# Patient Record
Sex: Male | Born: 1985
Health system: Southern US, Community
[De-identification: ages and names within clinical notes are randomized; demographics above are authoritative.]

## PROBLEM LIST (undated history)

## (undated) DIAGNOSIS — F909 Attention-deficit hyperactivity disorder, unspecified type: Secondary | ICD-10-CM

---
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
## 2007-07-07 ENCOUNTER — Encounter: Admission: RE | Admit: 2007-07-07 | Discharge: 2007-07-07 | Payer: Self-pay | Admitting: Gastroenterology

## 2008-05-04 IMAGING — RF DG ESOPHAGUS
11 of 13 series · 20 of 24 positions shown · non-contrast
Comparison: none

CLINICAL DATA: Epigastric burning and pain.  Vomiting. 
 BARIUM ESOPHAGRAM:

[Series 1: run · 2 of 8 slices shown (1 of 11)]
[im 1/8]
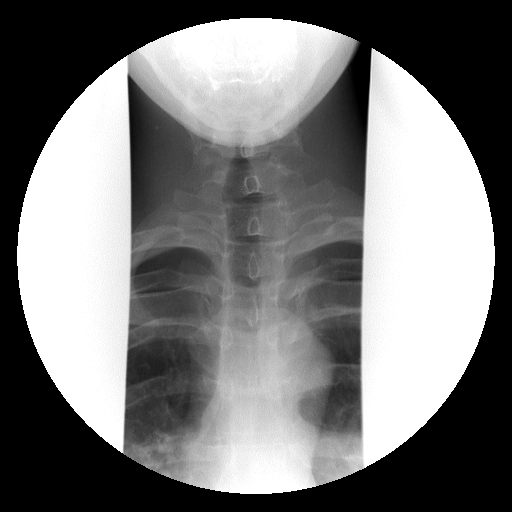
[im 4/8]
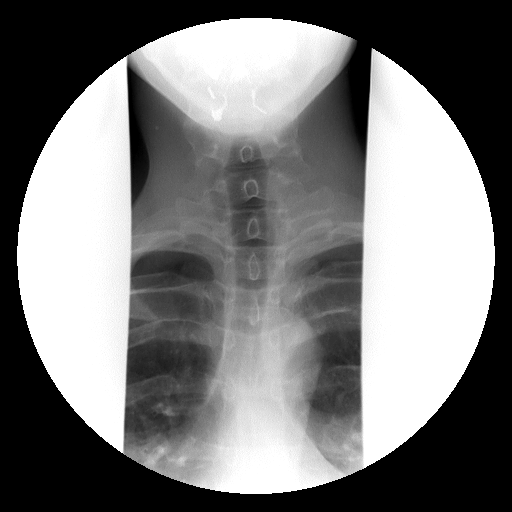

[Series 2: run · 2 of 5 slices shown (2 of 11)]
[im 1/5]
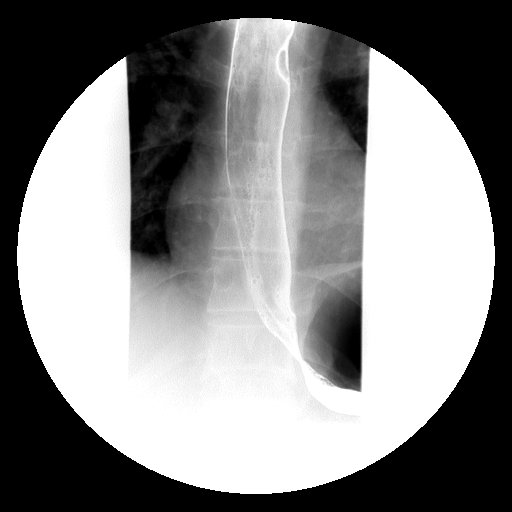
[im 5/5]
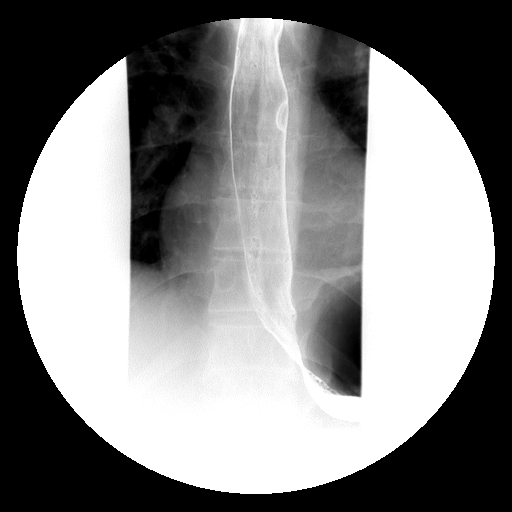

[Series 3: run · 1 of 1 slices shown (3 of 11)]
[im 1/1]
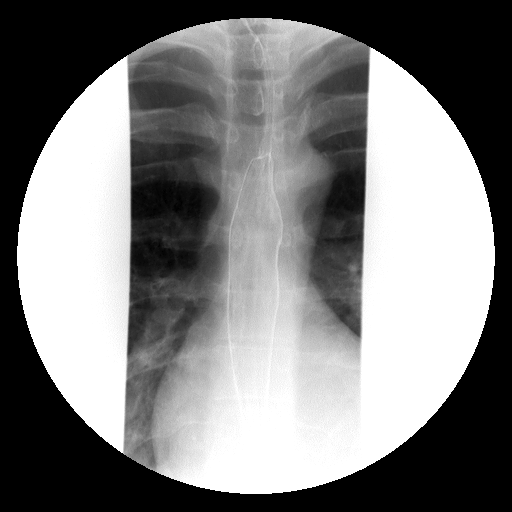

[Series 4: run · 1 of 1 slices shown (4 of 11)]
[im 1/1]
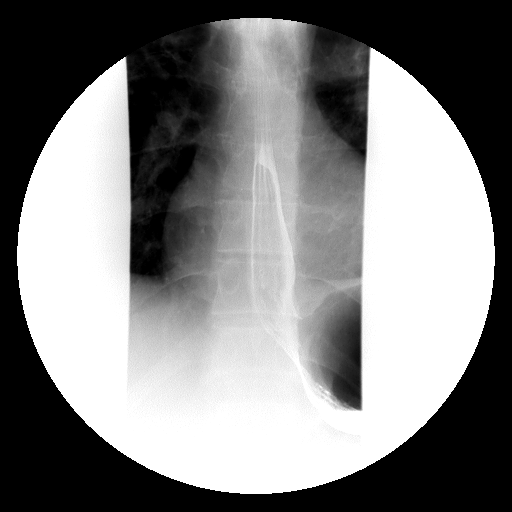

[Series 5: run · 3 of 8 slices shown (5 of 11)]
[im 1/8]
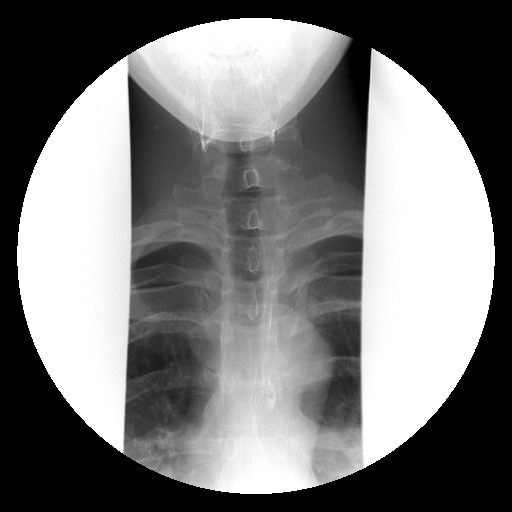
[im 5/8]
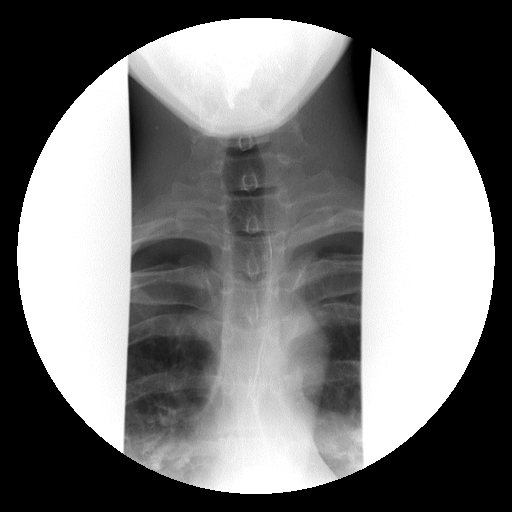
[im 8/8]
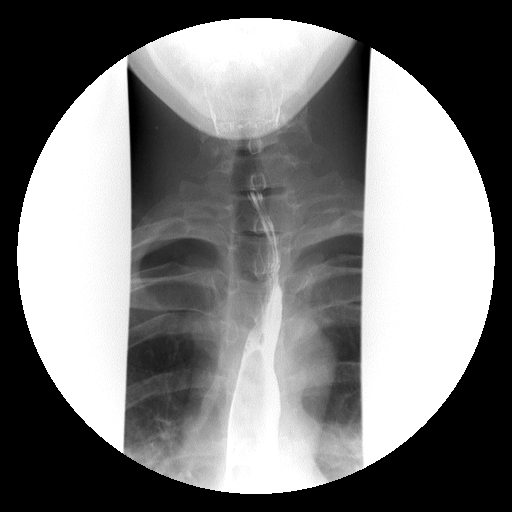

[Series 6: run · 3 of 6 slices shown (6 of 11)]
[im 1/6]
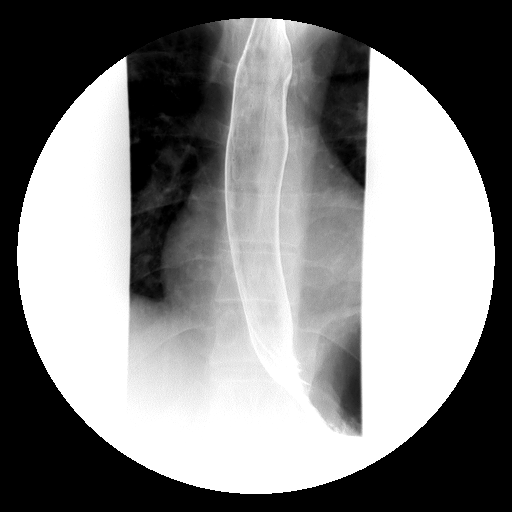
[im 3/6]
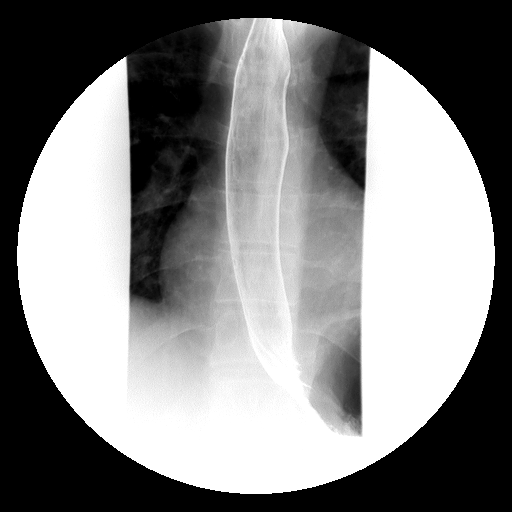
[im 6/6]
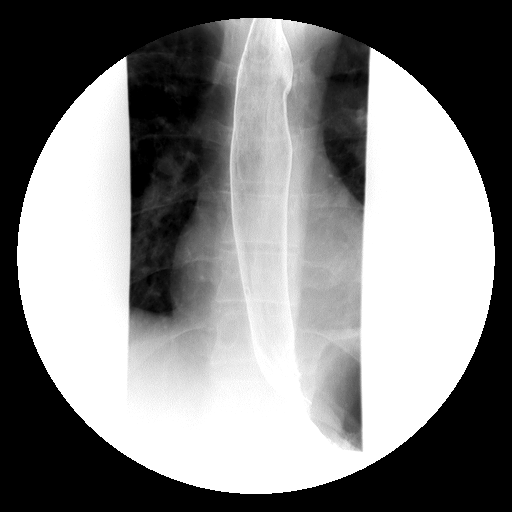

[Series 8: run · 4 of 8 slices shown (7 of 11)]
[im 1/8]
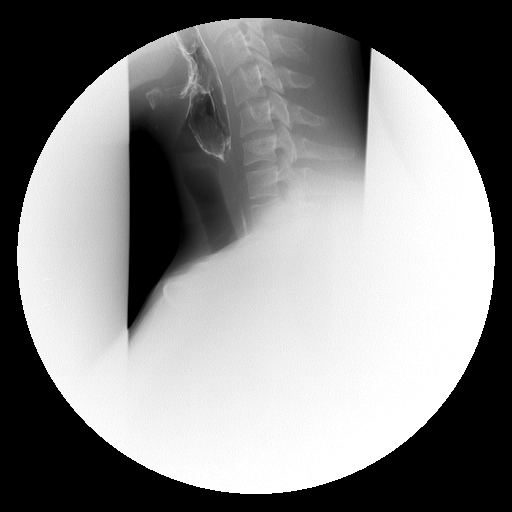
[im 3/8]
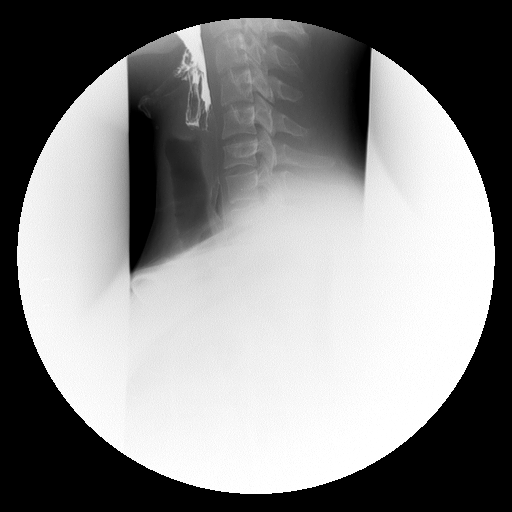
[im 5/8]
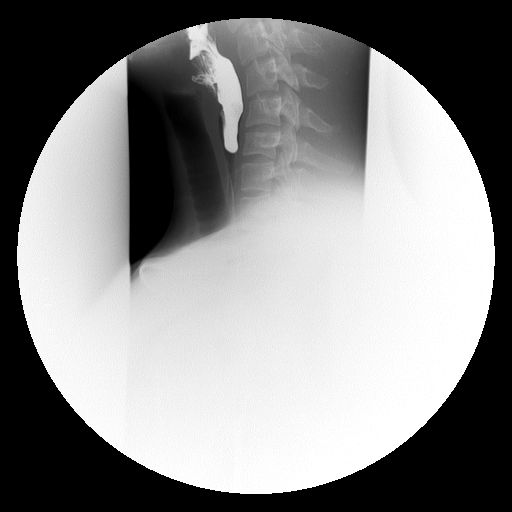
[im 8/8]
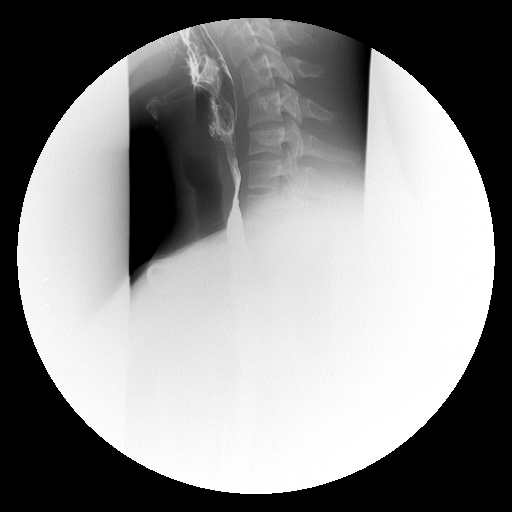

[Series 9: run · 1 of 2 slices shown (8 of 11)]
[im 1/2]
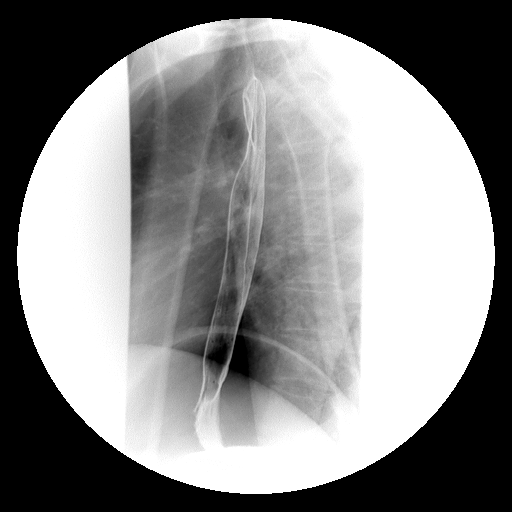

[Series 11: run · 1 of 1 slices shown (9 of 11)]
[im 1/1]
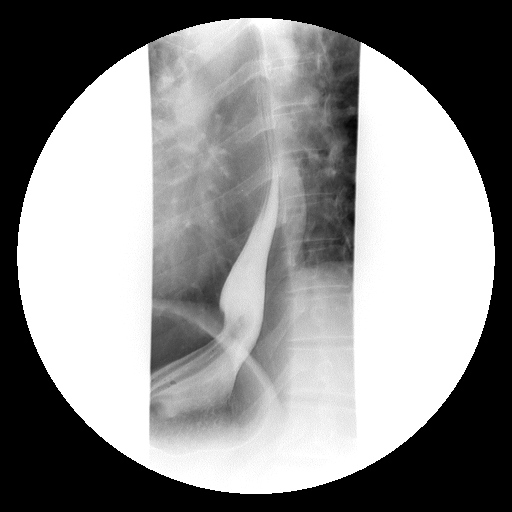

[Series 12: run · 1 of 1 slices shown (10 of 11)]
[im 1/1]
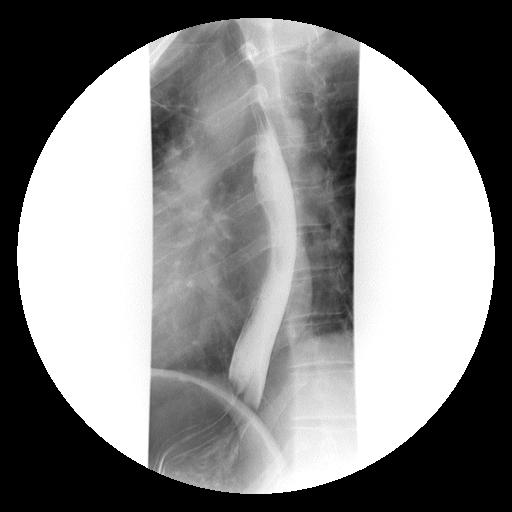

[Series 13: run · 1 of 1 slices shown (11 of 11)]
[im 1/1]
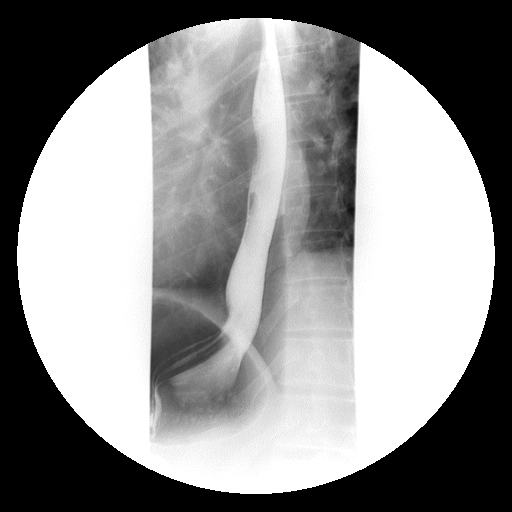

[20 of 24 positions shown; findings below may reference images not displayed]

FINDINGS: The mucosa and motility of the esophagus are normal.  No evidence of hiatal hernia or gastroesophageal reflux.  No discrete esophagitis or mass.   The vallecula and piriform sinuses are bilaterally symmetrical and normal.  Patient ingested a 13 mm barium tablet and it passed immediately from the mouth to the stomach with no delay.
IMPRESSION: Normal barium esophagram.

## 2008-07-15 ENCOUNTER — Emergency Department (HOSPITAL_COMMUNITY): Admission: EM | Admit: 2008-07-15 | Discharge: 2008-07-15 | Payer: Self-pay | Admitting: Emergency Medicine

## 2013-04-01 ENCOUNTER — Ambulatory Visit (INDEPENDENT_AMBULATORY_CARE_PROVIDER_SITE_OTHER): Payer: Self-pay | Admitting: General Surgery

## 2013-04-11 ENCOUNTER — Encounter (INDEPENDENT_AMBULATORY_CARE_PROVIDER_SITE_OTHER): Payer: Self-pay | Admitting: General Surgery

## 2014-07-05 ENCOUNTER — Other Ambulatory Visit (INDEPENDENT_AMBULATORY_CARE_PROVIDER_SITE_OTHER): Payer: Self-pay | Admitting: Surgery

## 2014-07-05 NOTE — H&P (Signed)
Ollivander See. Corrales 07/05/2014 9:37 AM Location: [facility] Patient #: [id number] DOB: 07-06-1986 Single / Language: [place] / Race: White Male  History of Present Illness Ardeth Sportsman MD; 07/05/2014 10:32 AM) Patient words: warts.  The patient is a 28 year old male who presents with condyloma. Patient sent for surgical consultation by Jarrett Soho, PA, for concern genital and anal condyloma/warts Pleasant young male. History of lesions around the perianal region. Treatment with Aldara cream. History of unprotected sex in the past. One episode of unprotected anal sex 4 years ago. Testicular HIB. Negative. Patient normally has 2 bowel today. Patient has noted some lesions. Treated with topical Aldara cream. Has not been able to get rid of them. Wished. I reevaluated. Concern for warts. Surgical consultation requested. Patient does have some issues with occasional nausea vomiting and loose bowel movements. Thinks is related to spicy foods. Recalls getting endoscopy by [facility] gastroenterology 2 years ago.   Other Problems (Ammie Eversole, LPN; 04/54/0981 9:37 AM) No pertinent past medical history  Past Surgical History (Ammie Eversole, LPN; 19/14/7829 9:37 AM) Oral Surgery  Diagnostic Studies History (Ammie Eversole, LPN; 56/21/3086 9:37 AM) Colonoscopy never  Allergies (Ammie Eversole, LPN; 57/84/6962 10:00 AM) Motrin *ANALGESICS - ANTI-INFLAMMATORY* Swelling.  Medication History (Ammie Eversole, LPN; 95/28/4132 10:00 AM) No Current Medications  Social History (Ammie Eversole, LPN; 44/07/270 9:37 AM) Alcohol use Moderate alcohol use. Caffeine use Coffee. No drug use Tobacco use Current every day smoker.  Family History Deon Pilling, LPN; 53/66/4403 9:37 AM) Diabetes Mellitus Father. Hypertension Father.  Review of Systems (Ammie Eversole LPN; 47/42/5956 9:37 AM) General Not Present- Appetite Loss, Chills, Fatigue, Fever, Night  Sweats, Weight Gain and Weight Loss. Skin Present- New Lesions. Not Present- Change in Wart/Mole, Dryness, Hives, Jaundice, Non-Healing Wounds, Rash and Ulcer. HEENT Not Present- Earache, Hearing Loss, Hoarseness, Nose Bleed, Oral Ulcers, Ringing in the Ears, Seasonal Allergies, Sinus Pain, Sore Throat, Visual Disturbances, Wears glasses/contact lenses and Yellow Eyes. Respiratory Not Present- Bloody sputum, Chronic Cough, Difficulty Breathing, Snoring and Wheezing. Breast Not Present- Breast Mass, Breast Pain, Nipple Discharge and Skin Changes. Cardiovascular Not Present- Chest Pain, Difficulty Breathing Lying Down, Leg Cramps, Palpitations, Rapid Heart Rate, Shortness of Breath and Swelling of Extremities. Gastrointestinal Present- Rectal Pain. Not Present- Abdominal Pain, Bloating, Bloody Stool, Change in Bowel Habits, Chronic diarrhea, Constipation, Difficulty Swallowing, Excessive gas, Gets full quickly at meals, Hemorrhoids, Indigestion, Nausea and Vomiting. Musculoskeletal Not Present- Back Pain, Joint Pain, Joint Stiffness, Muscle Pain, Muscle Weakness and Swelling of Extremities. Neurological Not Present- Decreased Memory, Fainting, Headaches, Numbness, Seizures, Tingling, Tremor, Trouble walking and Weakness. Psychiatric Not Present- Anxiety, Bipolar, Change in Sleep Pattern, Depression, Fearful and Frequent crying. Endocrine Not Present- Cold Intolerance, Excessive Hunger, Hair Changes, Heat Intolerance, Hot flashes and New Diabetes. Hematology Not Present- Easy Bruising, Excessive bleeding, Gland problems, HIV and Persistent Infections.   Vitals (Ammie Eversole LPN; 38/75/6433 9:59 AM) 07/05/2014 9:59 AM Weight: 208.4 lb Height: 68.75in Body Surface Area: 2.14 m Body Mass Index: 31 kg/m Temp.: 98.57F(Oral)  Pulse: 72 (Regular)  Resp.: 16 (Unlabored)  BP: 124/80 (Sitting, Left Arm, Standard)    Physical Exam Ardeth Sportsman MD; 07/05/2014 10:21 AM) General Mental  Status-Alert. General Appearance-Not in acute distress, Not Sickly. Orientation-Oriented X3. Hydration-Well hydrated. Voice-Normal.  Integumentary Global Assessment Upon inspection and palpation of skin surfaces of the - Axillae: non-tender, no inflammation or ulceration, no drainage. and Distribution of scalp and body hair is normal. General Characteristics Temperature - normal warmth is  noted.  Head and Neck Head-normocephalic, atraumatic with no lesions or palpable masses. Face Global Assessment - atraumatic, no absence of expression. Neck Global Assessment - no abnormal movements, no bruit auscultated on the right, no bruit auscultated on the left, no decreased range of motion, non-tender. Trachea-midline. Thyroid Gland Characteristics - non-tender.  Eye Eyeball - Left-Extraocular movements intact, No Nystagmus. Eyeball - Right-Extraocular movements intact, No Nystagmus. Cornea - Left-No Hazy. Cornea - Right-No Hazy. Sclera/Conjunctiva - Left-No scleral icterus, No Discharge. Sclera/Conjunctiva - Right-No scleral icterus, No Discharge. Pupil - Left-Direct reaction to light normal. Pupil - Right-Direct reaction to light normal.  ENMT Ears Pinna - Left - no drainage observed, no generalized tenderness observed. Right - no drainage observed, no generalized tenderness observed. Nose and Sinuses External Inspection of the Nose - no destructive lesion observed. Inspection of the nares - Left - quiet respiration. Right - quiet respiration. Mouth and Throat Lips - Upper Lip - no fissures observed, no pallor noted. Lower Lip - no fissures observed, no pallor noted. Nasopharynx - no discharge present. Oral Cavity/Oropharynx - Tongue - no dryness observed. Oral Mucosa - no cyanosis observed. Hypopharynx - no evidence of airway distress observed.  Chest and Lung Exam Inspection Movements - Normal and Symmetrical. Accessory muscles - No use of accessory  muscles in breathing. Palpation Palpation of the chest reveals - Non-tender. Auscultation Breath sounds - Normal and Clear.  Cardiovascular Auscultation Rhythm - Regular. Murmurs & Other Heart Sounds - Auscultation of the heart reveals - No Murmurs and No Systolic Clicks.  Abdomen Inspection Inspection of the abdomen reveals - No Visible peristalsis and No Abnormal pulsations. Umbilicus - No Bleeding, No Urine drainage. Palpation/Percussion Palpation and Percussion of the abdomen reveal - Soft, Non Tender, No Rebound tenderness, No Rigidity (guarding) and No Cutaneous hyperesthesia.  Male Genitourinary Sexual Maturity Tanner 5 - Adult hair pattern and Adult penile size and shape. Note: Normal external male genitalia. Circumcised. No lesions on plan shaft penis. Small skin tags screwdown but not apparent on more close inspection.   Rectal Note: Perianal skin clean. Pedunculated anal condyloma prolapsing out of her and anal verge. Digital exam reveals numerous small warts carpeting anal canal. Sensitive. At least 2 cm proximally. No stricture. No nodularity. Nothing fixed to the sphincter complex. Normal sphincter tone. No obvious fissure or fistula. No significant hemorrhoids felt. Anoscopy held off secondary to sensitivity.   Peripheral Vascular Upper Extremity Inspection - Left - No Cyanotic nailbeds, Not Ischemic. Right - No Cyanotic nailbeds, Not Ischemic.  Neurologic Neurologic evaluation reveals -normal attention span and ability to concentrate, able to name objects and repeat phrases. Appropriate fund of knowledge , normal sensation and normal coordination. Mental Status Affect - not angry, not paranoid. Cranial Nerves-Normal Bilaterally. Gait-Normal.  Neuropsychiatric Mental status exam performed with findings of-able to articulate well with normal speech/language, rate, volume and coherence, thought content normal with ability to perform basic computations and  apply abstract reasoning and no evidence of hallucinations, delusions, obsessions or homicidal/suicidal ideation.  Musculoskeletal Global Assessment Spine, Ribs and Pelvis - no instability, subluxation or laxity. Right Upper Extremity - no instability, subluxation or laxity.  Lymphatic Head & Neck  General Head & Neck Lymphatics: Bilateral - Description - No Localized lymphadenopathy. Axillary  General Axillary Region: Bilateral - Description - No Localized lymphadenopathy. Femoral & Inguinal  Generalized Femoral & Inguinal Lymphatics: Left - Description - No Localized lymphadenopathy. Right - Description - No Localized lymphadenopathy.    Assessment & Plan Viviann Spare(Destyni Hoppel C.  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 2 sensitive and numerous to treat topically. Arty failed topical therapy.  I think this requires examination under anesthesia with ablation and/or removal. We'll try and use laser and minimize cautery excision to minimize pain and scarring. Then plug-in to condyloma maintenance.  He is HIV negative. He has transitioned to protected sex only.  The anatomy & physiology of the anorectal region was discussed. The pathophysiology of anorectal warts and differential diagnosis was discussed. Natural history risks without surgery was discussed such as further growth and cancer. I stressed the importance of office follow-up to catch early recurrence & minimize/halt progression of disease. Interventions such as cauterization by topical agents were discussed.  The patient's symptoms are not adequately controlled by non-operative treatments. I feel the risks & problems of no surgery outweigh the operative risks; therefore, I recommended surgery to treat the anal warts by removal, ablation and/or cauterization.  Risks such as bleeding, infection, need for further treatment, heart  attack, death, and other risks were discussed. I noted a good likelihood this will help address the problem. Goals of post-operative recovery were discussed as well. Possibility that this will not correct all symptoms was explained. Post-operative pain, bleeding, constipation, and other problems after surgery were discussed. We will work to minimize complications. Educational handouts further explaining the pathology, treatment options, and bowel regimen were given as well. Questions were answered. The patient expresses understanding & wishes to proceed with surgery. Current Plans  Schedule for Surgery Pt Education - [facility] Anal Warts (Kairee Kozma) Pt Education - [facility] Rectal Surgery HCI (Cane Dubray): discussed with patient and provided information. Pt Education - [facility] Good Bowel Health (Ayah Cozzolino) Pt Education - [facility] Pain Control (Glenis Musolf)

## 2015-05-21 ENCOUNTER — Encounter (HOSPITAL_COMMUNITY): Payer: Self-pay | Admitting: Emergency Medicine

## 2015-05-21 ENCOUNTER — Emergency Department (HOSPITAL_COMMUNITY)
Admission: EM | Admit: 2015-05-21 | Discharge: 2015-05-21 | Disposition: A | Discharge status: Home / Self Care | Payer: BLUE CROSS/BLUE SHIELD | Attending: Emergency Medicine | Admitting: Emergency Medicine

## 2015-05-21 DIAGNOSIS — X58XXXA Exposure to other specified factors, initial encounter: Secondary | ICD-10-CM | POA: Insufficient documentation

## 2015-05-21 DIAGNOSIS — T782XXA Anaphylactic shock, unspecified, initial encounter: Secondary | ICD-10-CM | POA: Diagnosis not present

## 2015-05-21 DIAGNOSIS — Y9289 Other specified places as the place of occurrence of the external cause: Secondary | ICD-10-CM | POA: Insufficient documentation

## 2015-05-21 DIAGNOSIS — Z79899 Other long term (current) drug therapy: Secondary | ICD-10-CM | POA: Diagnosis not present

## 2015-05-21 DIAGNOSIS — Y998 Other external cause status: Secondary | ICD-10-CM | POA: Insufficient documentation

## 2015-05-21 DIAGNOSIS — Y9389 Activity, other specified: Secondary | ICD-10-CM | POA: Diagnosis not present

## 2015-05-21 DIAGNOSIS — F909 Attention-deficit hyperactivity disorder, unspecified type: Secondary | ICD-10-CM | POA: Insufficient documentation

## 2015-05-21 DIAGNOSIS — R21 Rash and other nonspecific skin eruption: Secondary | ICD-10-CM | POA: Diagnosis present

## 2015-05-21 HISTORY — DX: Attention-deficit hyperactivity disorder, unspecified type: F90.9

## 2015-05-21 LAB — CBC WITH DIFFERENTIAL/PLATELET
Basophils Absolute: 0 10*3/uL (ref 0.0–0.1)
Basophils Relative: 0 %
EOS ABS: 0.2 10*3/uL (ref 0.0–0.7)
EOS PCT: 2 %
HCT: 41.2 % (ref 39.0–52.0)
Hemoglobin: 14.2 g/dL (ref 13.0–17.0)
LYMPHS ABS: 1.7 10*3/uL (ref 0.7–4.0)
LYMPHS PCT: 18 %
MCH: 32.5 pg (ref 26.0–34.0)
MCHC: 34.5 g/dL (ref 30.0–36.0)
MCV: 94.3 fL (ref 78.0–100.0)
MONO ABS: 0.6 10*3/uL (ref 0.1–1.0)
MONOS PCT: 7 %
Neutro Abs: 6.8 10*3/uL (ref 1.7–7.7)
Neutrophils Relative %: 73 %
Platelets: 227 10*3/uL (ref 150–400)
RBC: 4.37 MIL/uL (ref 4.22–5.81)
RDW: 12.3 % (ref 11.5–15.5)
WBC: 9.3 10*3/uL (ref 4.0–10.5)

## 2015-05-21 LAB — BASIC METABOLIC PANEL
Anion gap: 10 (ref 5–15)
BUN: 21 mg/dL — AB (ref 6–20)
CHLORIDE: 105 mmol/L (ref 101–111)
CO2: 25 mmol/L (ref 22–32)
CREATININE: 1.23 mg/dL (ref 0.61–1.24)
Calcium: 9.2 mg/dL (ref 8.9–10.3)
GFR calc Af Amer: >60 mL/min (ref >60)
GFR calc non Af Amer: >60 mL/min (ref >60)
GLUCOSE: 108 mg/dL — AB (ref 65–99)
Potassium: 3.9 mmol/L (ref 3.5–5.1)
SODIUM: 140 mmol/L (ref 135–145)

## 2015-05-21 MED ORDER — SODIUM CHLORIDE 0.9 % IV SOLN: 1000.0000 mL | INTRAVENOUS | Status: DC

## 2015-05-21 MED ORDER — FAMOTIDINE IN NACL 20-0.9 MG/50ML-% IV SOLN
20.0000 mg | Freq: Once | INTRAVENOUS | Status: AC
  Administered 2015-05-21: 20 mg via INTRAVENOUS
  Filled 2015-05-21: qty 50

## 2015-05-21 MED ORDER — METHYLPREDNISOLONE SODIUM SUCC 125 MG IJ SOLR
INTRAMUSCULAR | Status: AC
  Administered 2015-05-21: 125 mg via INTRAVENOUS
  Filled 2015-05-21: qty 2

## 2015-05-21 MED ORDER — EPINEPHRINE 0.3 MG/0.3ML IJ SOAJ
INTRAMUSCULAR | Status: AC
  Administered 2015-05-21: 0.3 mg via INTRAMUSCULAR
  Filled 2015-05-21: qty 0.3

## 2015-05-21 MED ORDER — DIPHENHYDRAMINE HCL 50 MG/ML IJ SOLN
50.0000 mg | Freq: Once | INTRAMUSCULAR | Status: AC
  Administered 2015-05-21: 50 mg via INTRAVENOUS

## 2015-05-21 MED ORDER — EPINEPHRINE 0.3 MG/0.3ML IJ SOAJ
0.3000 mg | Freq: Once | INTRAMUSCULAR | Status: AC
  Administered 2015-05-21: 0.3 mg via INTRAMUSCULAR

## 2015-05-21 MED ORDER — METHYLPREDNISOLONE SODIUM SUCC 125 MG IJ SOLR
125.0000 mg | Freq: Once | INTRAMUSCULAR | Status: AC
  Administered 2015-05-21: 125 mg via INTRAVENOUS

## 2015-05-21 MED ORDER — DIPHENHYDRAMINE HCL 50 MG/ML IJ SOLN
INTRAMUSCULAR | Status: AC
  Administered 2015-05-21: 50 mg via INTRAVENOUS
  Filled 2015-05-21: qty 1

## 2015-05-21 MED ORDER — EPINEPHRINE 0.3 MG/0.3ML IJ SOAJ: 0.3000 mg | INTRAMUSCULAR | Status: AC | PRN

## 2015-05-21 MED ORDER — PREDNISONE 20 MG PO TABS: 60.0000 mg | ORAL_TABLET | Freq: Every day | ORAL | Status: AC

## 2015-05-21 MED ORDER — SODIUM CHLORIDE 0.9 % IV SOLN
1000.0000 mL | Freq: Once | INTRAVENOUS | Status: AC
  Administered 2015-05-21: 1000 mL via INTRAVENOUS

## 2015-05-21 NOTE — Discharge Instructions (Signed)

## 2015-05-21 NOTE — ED Notes (Signed)
Pt was sleeping, stung by a bee in the throat. Having chest tightness, SOB, rash all over body, neck/face/chest redness. 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

## 2015-05-21 NOTE — ED Notes (Signed)
After all meds pt states he feels much better, less redness around face/throat, states he no longer has CP/tightness or SOB

## 2015-05-21 NOTE — ED Provider Notes (Signed)
CSN: [id number]     Arrival date & time 05/21/15  1804 History   First MD Initiated Contact with Patient 05/21/15 1814     Chief Complaint  Patient presents with  . Allergic Reaction     (Consider location/radiation/quality/duration/timing/severity/associated sxs/prior Treatment) Patient is a 29 y.o. male presenting with allergic reaction.  Allergic Reaction Presenting symptoms: itching, rash and swelling   Severity:  Severe Prior allergic episodes:  Allergies to medications Context: insect bite/sting (R neck)   Relieved by:  None tried Worsened by:  Nothing tried Ineffective treatments:  None tried   Past Medical History  Diagnosis Date  . ADHD (attention deficit hyperactivity disorder)    History reviewed. No pertinent past surgical history. History reviewed. No pertinent family history. Social History  Substance Use Topics  . Smoking status: None  . Smokeless tobacco: None  . Alcohol Use: None    Review of Systems  Skin: Positive for itching and rash.  All other systems reviewed and are negative.     Allergies  Bee venom and Ibuprofen  Home Medications   Prior to Admission medications   Medication Sig Start Date End Date Taking? Authorizing Provider  amphetamine-dextroamphetamine (ADDERALL) 20 MG tablet Take 20 mg by mouth 2 (two) times daily.   Yes Historical Provider, MD  PE-DM-APAP & Doxylamin-DM-APAP (VICKS DAYQUIL/NYQUIL CLD & FLU) (LIQUID) MISC Take 1 Dose by mouth 3 (three) times daily as needed (cold symtpoms).   Yes Historical Provider, MD  EPINEPHrine 0.3 mg/0.3 mL IJ SOAJ injection Inject 0.3 mLs (0.3 mg total) into the muscle as needed (rapidly advancing allergic reaction symptoms or difficulty breathing from suspected allergic reaction). 05/21/15   Mirian MoMatthew Gentry, MD  predniSONE (DELTASONE) 20 MG tablet Take 3 tablets (60 mg total) by mouth daily. 05/21/15   Mirian MoMatthew Gentry, MD   BP 124/57 mmHg  Pulse 93  Temp(Src) 98 F (36.7 C) (Oral)  Resp 19   SpO2 99% Physical Exam  Constitutional: He is oriented to person, place, and time. He appears well-developed and well-nourished.  HENT:  Head: Normocephalic and atraumatic.  Eyes: Conjunctivae and EOM are normal.  Neck: Normal range of motion. Neck supple.  Cardiovascular: Normal rate, regular rhythm and normal heart sounds.   Pulmonary/Chest: Effort normal and breath sounds normal. No respiratory distress.  Abdominal: He exhibits no distension. There is no tenderness. There is no rebound and no guarding.  Musculoskeletal: Normal range of motion.  Neurological: He is alert and oriented to person, place, and time.  Skin: Skin is warm and dry.  Urticarial rash of neck and chest  Vitals reviewed.   ED Course  Procedures (including critical care time) Labs Review Labs Reviewed  BASIC METABOLIC PANEL - Abnormal; Notable for the following:    Glucose, Bld 108 (*)    BUN 21 (*)    All other components within normal limits  CBC WITH DIFFERENTIAL/PLATELET  CBC WITH DIFFERENTIAL/PLATELET    Imaging Review No results found. I have personally reviewed and evaluated these images and lab results as part of my medical decision-making.   EKG Interpretation None      MDM   Final diagnoses:  Anaphylactic reaction, initial encounter    29 y.o. 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 Occurred approximately 30 minutes prior to arrival. Physical exam with diffuse and rapidly spreading rash. Patient states symptoms feel worse than prior episode of anaphylaxis. EpiPen administered with solumedrol, zantac, and benadryl  Wu unremarkable.  Symptoms vastly improved.  Discussed possibility of biphasic reaction.  Pt stated he would like to return home.  I encouraged him to stay for slightly more monitoring (only in ED fo 1.5 hours), however he understood and reiterated risks and would still like to leave.  DC home in stable  condition  I have reviewed all laboratory and imaging studies if ordered as above  1. Anaphylactic reaction, initial encounter         Mirian Mo, MD 05/24/15 1004

## 2016-06-16 ENCOUNTER — Ambulatory Visit (HOSPITAL_COMMUNITY)
Admission: RE | Admit: 2016-06-16 | Discharge: 2016-06-16 | Disposition: A | Discharge status: Home / Self Care | Payer: BLUE CROSS/BLUE SHIELD | Source: Ambulatory Visit | Attending: Sports Medicine | Admitting: Sports Medicine

## 2016-06-16 ENCOUNTER — Other Ambulatory Visit (HOSPITAL_COMMUNITY): Payer: Self-pay | Admitting: Sports Medicine

## 2016-06-16 DIAGNOSIS — Z135 Encounter for screening for eye and ear disorders: Secondary | ICD-10-CM | POA: Diagnosis present

## 2016-06-16 DIAGNOSIS — M25562 Pain in left knee: Secondary | ICD-10-CM

## 2016-06-16 DIAGNOSIS — Z0189 Encounter for other specified special examinations: Secondary | ICD-10-CM | POA: Insufficient documentation

## 2016-06-16 DIAGNOSIS — Z1810 Retained metal fragments, unspecified: Secondary | ICD-10-CM | POA: Diagnosis present

## 2017-04-14 IMAGING — CR DG ORBITS FOR FOREIGN BODY
2 series · 2 of 2 positions shown · non-contrast
Comparison: No recent prior.

CLINICAL DATA: MRI screen .

EXAM:
ORBITS FOR FOREIGN BODY - 2 VIEW

[w waters (1 of 2)]
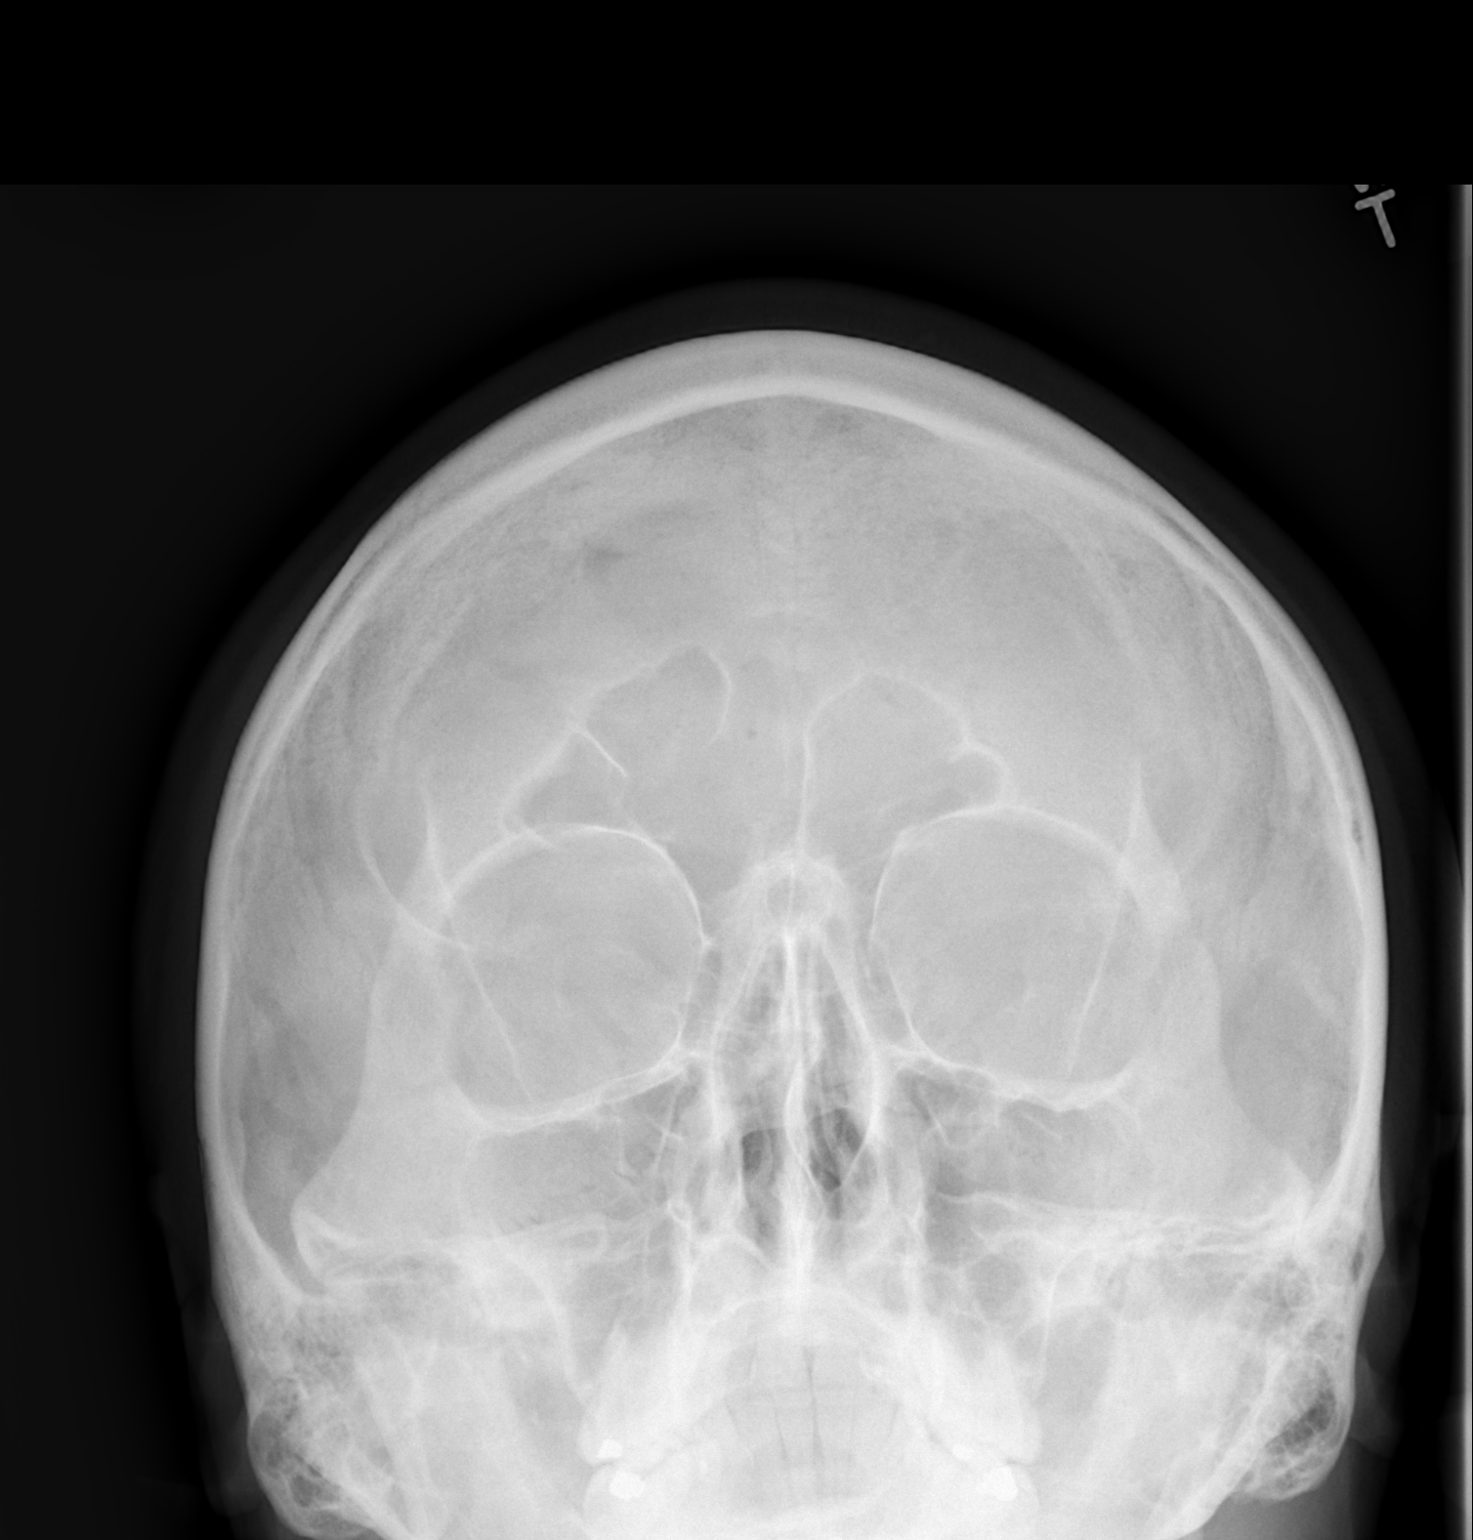

[w waters (2 of 2)]
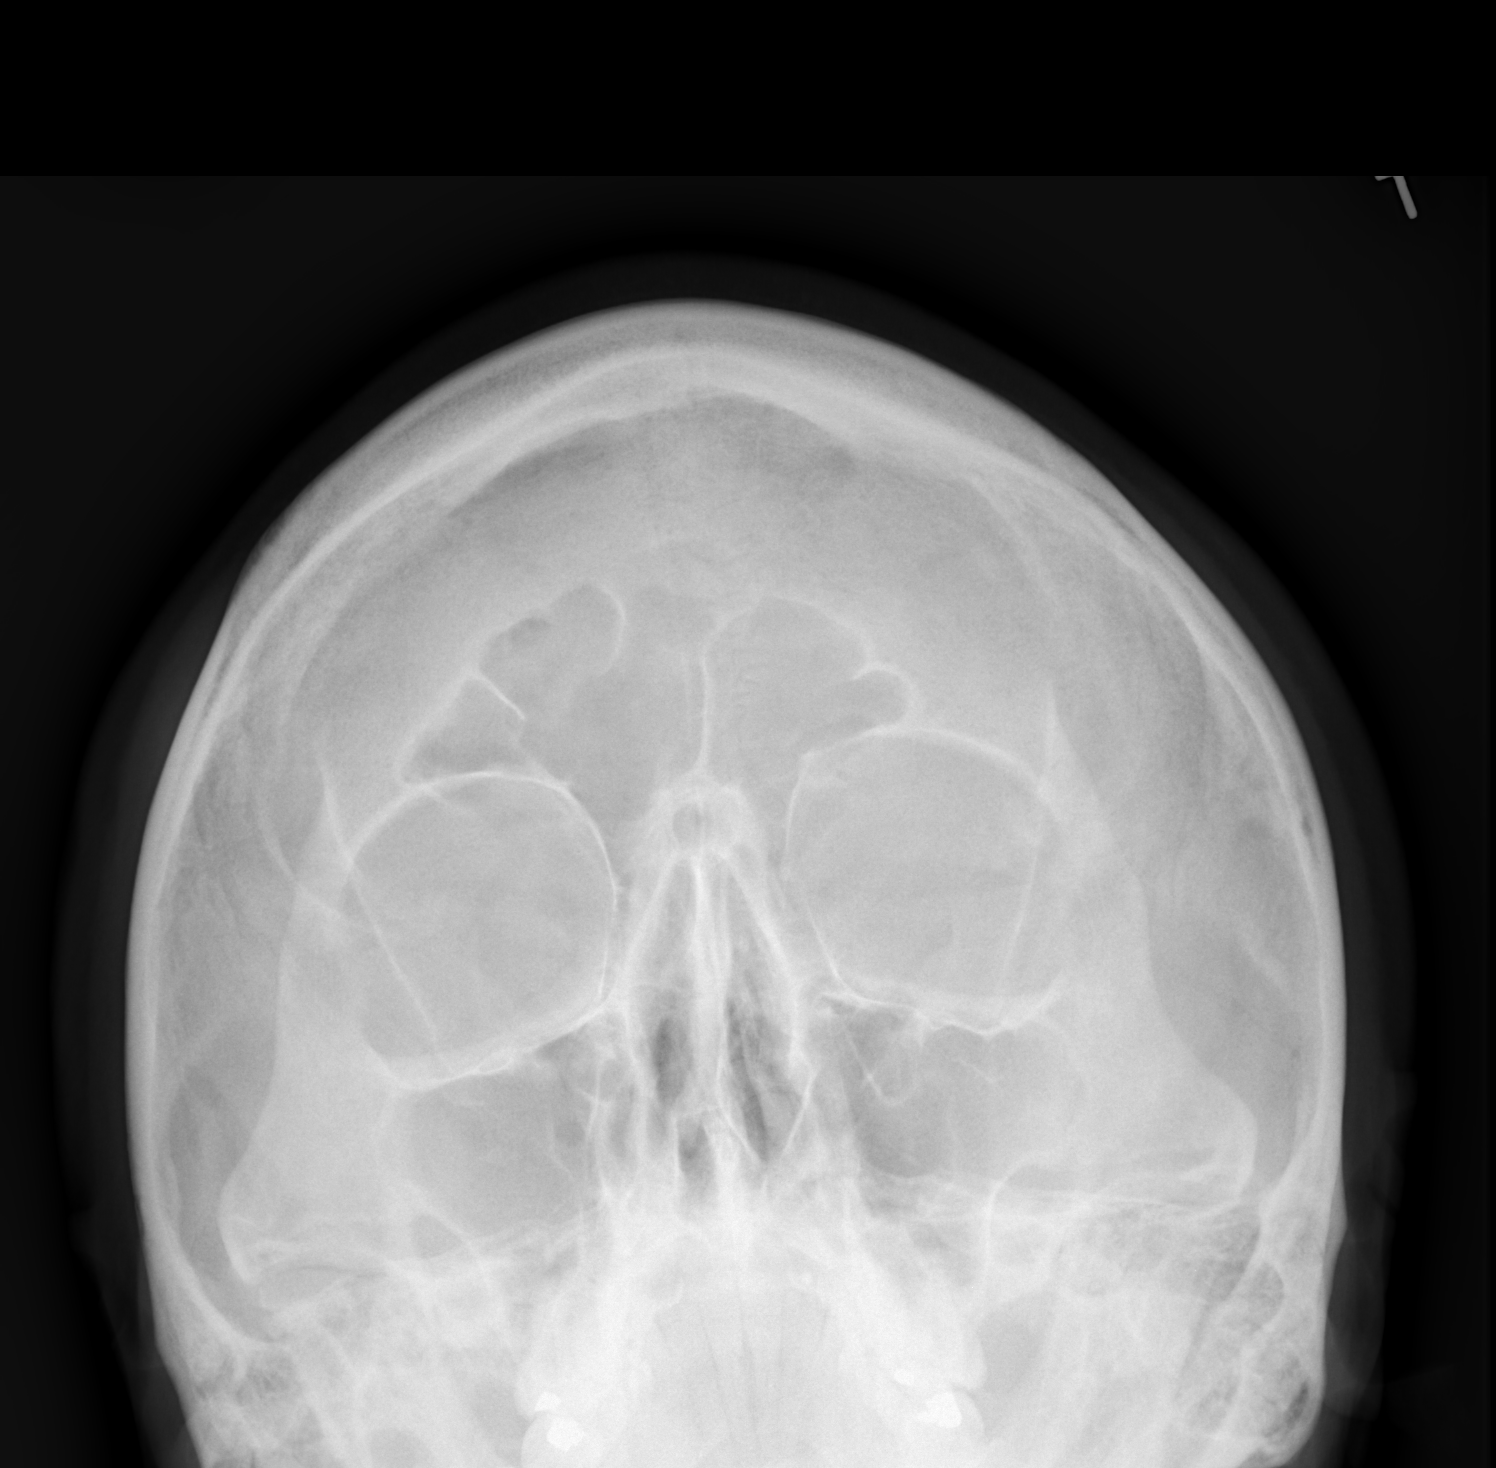

[2 of 2 positions shown; findings below may reference images not displayed]

FINDINGS: There is no evidence of metallic foreign body within the orbits. No
significant bone abnormality identified.
IMPRESSION: No evidence of metallic foreign body within the orbits.
